# Patient Record
Sex: Female | Born: 2000 | Race: White | Hispanic: No | Marital: Single | Smoking: Never smoker
Health system: Southern US, Community
[De-identification: ages and names within clinical notes are randomized; demographics above are authoritative.]

---
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
## 2021-07-13 ENCOUNTER — Ambulatory Visit (INDEPENDENT_AMBULATORY_CARE_PROVIDER_SITE_OTHER): Payer: BC Managed Care – PPO | Admitting: Pediatrics

## 2021-07-13 ENCOUNTER — Other Ambulatory Visit: Payer: Self-pay

## 2021-07-13 DIAGNOSIS — Z130 Encounter for screening for diseases of the blood and blood-forming organs and certain disorders involving the immune mechanism: Secondary | ICD-10-CM | POA: Diagnosis not present

## 2021-07-13 DIAGNOSIS — Z23 Encounter for immunization: Secondary | ICD-10-CM | POA: Diagnosis not present

## 2021-07-13 NOTE — Progress Notes (Signed)
MenB vaccine per orders. Indications, contraindications and side effects of vaccine/vaccines discussed with parent and parent verbally expressed understanding and also agreed with the administration of vaccine/vaccines as ordered above today.Handout (VIS) given for each vaccine at this visit.  Sickle cell trait unknown, result required for school. Sickle cell lab per orders.

## 2021-07-15 LAB — SICKLE CELL SCREEN: Sickle Solubility Test - HGBRFX: NEGATIVE

## 2021-08-15 DIAGNOSIS — M79645 Pain in left finger(s): Secondary | ICD-10-CM | POA: Diagnosis not present

## 2021-09-12 DIAGNOSIS — M2211 Recurrent subluxation of patella, right knee: Secondary | ICD-10-CM | POA: Diagnosis not present

## 2021-09-12 DIAGNOSIS — M25561 Pain in right knee: Secondary | ICD-10-CM | POA: Diagnosis not present

## 2021-09-26 ENCOUNTER — Encounter (HOSPITAL_COMMUNITY): Payer: Self-pay | Admitting: Emergency Medicine

## 2021-09-26 ENCOUNTER — Ambulatory Visit (HOSPITAL_COMMUNITY)
Admission: EM | Admit: 2021-09-26 | Discharge: 2021-09-26 | Discharge status: Against Medical Advice | Payer: BC Managed Care – PPO

## 2021-09-26 ENCOUNTER — Other Ambulatory Visit: Payer: Self-pay

## 2021-09-26 NOTE — ED Triage Notes (Signed)
Pt c/o left big toe nail pain that thinks ingrown

## 2021-10-18 DIAGNOSIS — S83001S Unspecified subluxation of right patella, sequela: Secondary | ICD-10-CM | POA: Diagnosis not present

## 2021-10-18 DIAGNOSIS — M2241 Chondromalacia patellae, right knee: Secondary | ICD-10-CM | POA: Diagnosis not present

## 2022-01-01 DIAGNOSIS — M2211 Recurrent subluxation of patella, right knee: Secondary | ICD-10-CM | POA: Diagnosis not present

## 2022-01-09 DIAGNOSIS — G8918 Other acute postprocedural pain: Secondary | ICD-10-CM | POA: Diagnosis not present

## 2022-01-09 DIAGNOSIS — M94261 Chondromalacia, right knee: Secondary | ICD-10-CM | POA: Diagnosis not present

## 2022-01-09 DIAGNOSIS — M65861 Other synovitis and tenosynovitis, right lower leg: Secondary | ICD-10-CM | POA: Diagnosis not present

## 2022-01-09 DIAGNOSIS — M659 Synovitis and tenosynovitis, unspecified: Secondary | ICD-10-CM | POA: Diagnosis not present

## 2022-01-09 DIAGNOSIS — M25361 Other instability, right knee: Secondary | ICD-10-CM | POA: Diagnosis not present

## 2022-01-09 DIAGNOSIS — M6751 Plica syndrome, right knee: Secondary | ICD-10-CM | POA: Diagnosis not present

## 2022-01-09 DIAGNOSIS — S83001A Unspecified subluxation of right patella, initial encounter: Secondary | ICD-10-CM | POA: Diagnosis not present

## 2022-01-15 DIAGNOSIS — M25661 Stiffness of right knee, not elsewhere classified: Secondary | ICD-10-CM | POA: Diagnosis not present

## 2022-01-15 DIAGNOSIS — R6889 Other general symptoms and signs: Secondary | ICD-10-CM | POA: Diagnosis not present

## 2022-01-15 DIAGNOSIS — M25561 Pain in right knee: Secondary | ICD-10-CM | POA: Diagnosis not present

## 2022-01-15 DIAGNOSIS — Z9889 Other specified postprocedural states: Secondary | ICD-10-CM | POA: Diagnosis not present

## 2022-01-21 DIAGNOSIS — Z9889 Other specified postprocedural states: Secondary | ICD-10-CM | POA: Diagnosis not present

## 2022-01-21 DIAGNOSIS — M25661 Stiffness of right knee, not elsewhere classified: Secondary | ICD-10-CM | POA: Diagnosis not present

## 2022-01-21 DIAGNOSIS — R6889 Other general symptoms and signs: Secondary | ICD-10-CM | POA: Diagnosis not present

## 2022-01-21 DIAGNOSIS — M25561 Pain in right knee: Secondary | ICD-10-CM | POA: Diagnosis not present

## 2022-01-23 DIAGNOSIS — Z9889 Other specified postprocedural states: Secondary | ICD-10-CM | POA: Diagnosis not present

## 2022-01-23 DIAGNOSIS — M25661 Stiffness of right knee, not elsewhere classified: Secondary | ICD-10-CM | POA: Diagnosis not present

## 2022-01-23 DIAGNOSIS — R6889 Other general symptoms and signs: Secondary | ICD-10-CM | POA: Diagnosis not present

## 2022-01-23 DIAGNOSIS — M25561 Pain in right knee: Secondary | ICD-10-CM | POA: Diagnosis not present

## 2022-01-28 DIAGNOSIS — M25561 Pain in right knee: Secondary | ICD-10-CM | POA: Diagnosis not present

## 2022-01-28 DIAGNOSIS — M25661 Stiffness of right knee, not elsewhere classified: Secondary | ICD-10-CM | POA: Diagnosis not present

## 2022-01-28 DIAGNOSIS — R6889 Other general symptoms and signs: Secondary | ICD-10-CM | POA: Diagnosis not present

## 2022-01-28 DIAGNOSIS — Z9889 Other specified postprocedural states: Secondary | ICD-10-CM | POA: Diagnosis not present

## 2022-01-30 DIAGNOSIS — Z9889 Other specified postprocedural states: Secondary | ICD-10-CM | POA: Diagnosis not present

## 2022-01-30 DIAGNOSIS — M25561 Pain in right knee: Secondary | ICD-10-CM | POA: Diagnosis not present

## 2022-01-30 DIAGNOSIS — M25661 Stiffness of right knee, not elsewhere classified: Secondary | ICD-10-CM | POA: Diagnosis not present

## 2022-01-30 DIAGNOSIS — R6889 Other general symptoms and signs: Secondary | ICD-10-CM | POA: Diagnosis not present

## 2022-02-06 DIAGNOSIS — M25561 Pain in right knee: Secondary | ICD-10-CM | POA: Diagnosis not present

## 2022-02-06 DIAGNOSIS — M25661 Stiffness of right knee, not elsewhere classified: Secondary | ICD-10-CM | POA: Diagnosis not present

## 2022-02-06 DIAGNOSIS — R6889 Other general symptoms and signs: Secondary | ICD-10-CM | POA: Diagnosis not present

## 2022-02-06 DIAGNOSIS — Z9889 Other specified postprocedural states: Secondary | ICD-10-CM | POA: Diagnosis not present

## 2022-02-11 DIAGNOSIS — R6889 Other general symptoms and signs: Secondary | ICD-10-CM | POA: Diagnosis not present

## 2022-02-11 DIAGNOSIS — M25661 Stiffness of right knee, not elsewhere classified: Secondary | ICD-10-CM | POA: Diagnosis not present

## 2022-02-11 DIAGNOSIS — Z9889 Other specified postprocedural states: Secondary | ICD-10-CM | POA: Diagnosis not present

## 2022-02-11 DIAGNOSIS — M25561 Pain in right knee: Secondary | ICD-10-CM | POA: Diagnosis not present

## 2022-02-13 ENCOUNTER — Other Ambulatory Visit: Payer: Self-pay | Admitting: Orthopedic Surgery

## 2022-02-13 DIAGNOSIS — M25562 Pain in left knee: Secondary | ICD-10-CM

## 2022-02-14 ENCOUNTER — Ambulatory Visit
Admission: RE | Admit: 2022-02-14 | Discharge: 2022-02-14 | Disposition: A | Discharge status: Home / Self Care | Payer: No Typology Code available for payment source | Source: Ambulatory Visit | Attending: Orthopedic Surgery | Admitting: Orthopedic Surgery

## 2022-02-14 ENCOUNTER — Other Ambulatory Visit: Payer: Self-pay

## 2022-02-14 DIAGNOSIS — M25562 Pain in left knee: Secondary | ICD-10-CM

## 2022-02-18 DIAGNOSIS — Z9889 Other specified postprocedural states: Secondary | ICD-10-CM | POA: Diagnosis not present

## 2022-02-18 DIAGNOSIS — R6889 Other general symptoms and signs: Secondary | ICD-10-CM | POA: Diagnosis not present

## 2022-02-18 DIAGNOSIS — M25561 Pain in right knee: Secondary | ICD-10-CM | POA: Diagnosis not present

## 2022-02-18 DIAGNOSIS — M25661 Stiffness of right knee, not elsewhere classified: Secondary | ICD-10-CM | POA: Diagnosis not present

## 2022-02-19 DIAGNOSIS — S83512A Sprain of anterior cruciate ligament of left knee, initial encounter: Secondary | ICD-10-CM | POA: Diagnosis not present

## 2022-02-19 DIAGNOSIS — S83242A Other tear of medial meniscus, current injury, left knee, initial encounter: Secondary | ICD-10-CM | POA: Diagnosis not present

## 2022-02-19 DIAGNOSIS — M228X2 Other disorders of patella, left knee: Secondary | ICD-10-CM | POA: Diagnosis not present

## 2022-03-06 DIAGNOSIS — X58XXXA Exposure to other specified factors, initial encounter: Secondary | ICD-10-CM | POA: Diagnosis not present

## 2022-03-06 DIAGNOSIS — S83232A Complex tear of medial meniscus, current injury, left knee, initial encounter: Secondary | ICD-10-CM | POA: Diagnosis not present

## 2022-03-06 DIAGNOSIS — M228X2 Other disorders of patella, left knee: Secondary | ICD-10-CM | POA: Diagnosis not present

## 2022-03-06 DIAGNOSIS — G8918 Other acute postprocedural pain: Secondary | ICD-10-CM | POA: Diagnosis not present

## 2022-03-06 DIAGNOSIS — S83512A Sprain of anterior cruciate ligament of left knee, initial encounter: Secondary | ICD-10-CM | POA: Diagnosis not present

## 2022-03-06 DIAGNOSIS — Y999 Unspecified external cause status: Secondary | ICD-10-CM | POA: Diagnosis not present

## 2022-03-12 DIAGNOSIS — M25662 Stiffness of left knee, not elsewhere classified: Secondary | ICD-10-CM | POA: Diagnosis not present

## 2022-03-12 DIAGNOSIS — Z9889 Other specified postprocedural states: Secondary | ICD-10-CM | POA: Diagnosis not present

## 2022-03-12 DIAGNOSIS — M25562 Pain in left knee: Secondary | ICD-10-CM | POA: Diagnosis not present

## 2022-03-12 DIAGNOSIS — Z7409 Other reduced mobility: Secondary | ICD-10-CM | POA: Diagnosis not present

## 2022-03-12 DIAGNOSIS — R29898 Other symptoms and signs involving the musculoskeletal system: Secondary | ICD-10-CM | POA: Diagnosis not present

## 2022-03-20 DIAGNOSIS — Z7409 Other reduced mobility: Secondary | ICD-10-CM | POA: Diagnosis not present

## 2022-03-20 DIAGNOSIS — M25562 Pain in left knee: Secondary | ICD-10-CM | POA: Diagnosis not present

## 2022-03-20 DIAGNOSIS — M25662 Stiffness of left knee, not elsewhere classified: Secondary | ICD-10-CM | POA: Diagnosis not present

## 2022-03-20 DIAGNOSIS — R29898 Other symptoms and signs involving the musculoskeletal system: Secondary | ICD-10-CM | POA: Diagnosis not present

## 2022-03-20 DIAGNOSIS — Z9889 Other specified postprocedural states: Secondary | ICD-10-CM | POA: Diagnosis not present

## 2022-03-27 DIAGNOSIS — Z7409 Other reduced mobility: Secondary | ICD-10-CM | POA: Diagnosis not present

## 2022-03-27 DIAGNOSIS — M25562 Pain in left knee: Secondary | ICD-10-CM | POA: Diagnosis not present

## 2022-03-27 DIAGNOSIS — Z9889 Other specified postprocedural states: Secondary | ICD-10-CM | POA: Diagnosis not present

## 2022-03-27 DIAGNOSIS — M25662 Stiffness of left knee, not elsewhere classified: Secondary | ICD-10-CM | POA: Diagnosis not present

## 2022-03-27 DIAGNOSIS — R29898 Other symptoms and signs involving the musculoskeletal system: Secondary | ICD-10-CM | POA: Diagnosis not present

## 2022-04-03 DIAGNOSIS — R29898 Other symptoms and signs involving the musculoskeletal system: Secondary | ICD-10-CM | POA: Diagnosis not present

## 2022-04-03 DIAGNOSIS — Z7409 Other reduced mobility: Secondary | ICD-10-CM | POA: Diagnosis not present

## 2022-04-03 DIAGNOSIS — M25562 Pain in left knee: Secondary | ICD-10-CM | POA: Diagnosis not present

## 2022-04-03 DIAGNOSIS — M25662 Stiffness of left knee, not elsewhere classified: Secondary | ICD-10-CM | POA: Diagnosis not present

## 2022-04-09 DIAGNOSIS — M25562 Pain in left knee: Secondary | ICD-10-CM | POA: Diagnosis not present

## 2022-04-09 DIAGNOSIS — R29898 Other symptoms and signs involving the musculoskeletal system: Secondary | ICD-10-CM | POA: Diagnosis not present

## 2022-04-09 DIAGNOSIS — Z9889 Other specified postprocedural states: Secondary | ICD-10-CM | POA: Diagnosis not present

## 2022-04-09 DIAGNOSIS — Z7409 Other reduced mobility: Secondary | ICD-10-CM | POA: Diagnosis not present

## 2022-04-09 DIAGNOSIS — M25662 Stiffness of left knee, not elsewhere classified: Secondary | ICD-10-CM | POA: Diagnosis not present

## 2022-04-15 DIAGNOSIS — M25662 Stiffness of left knee, not elsewhere classified: Secondary | ICD-10-CM | POA: Diagnosis not present

## 2022-04-15 DIAGNOSIS — R29898 Other symptoms and signs involving the musculoskeletal system: Secondary | ICD-10-CM | POA: Diagnosis not present

## 2022-04-15 DIAGNOSIS — M25562 Pain in left knee: Secondary | ICD-10-CM | POA: Diagnosis not present

## 2022-04-15 DIAGNOSIS — Z9889 Other specified postprocedural states: Secondary | ICD-10-CM | POA: Diagnosis not present

## 2022-04-15 DIAGNOSIS — Z7409 Other reduced mobility: Secondary | ICD-10-CM | POA: Diagnosis not present

## 2022-04-22 DIAGNOSIS — R29898 Other symptoms and signs involving the musculoskeletal system: Secondary | ICD-10-CM | POA: Diagnosis not present

## 2022-04-22 DIAGNOSIS — M25562 Pain in left knee: Secondary | ICD-10-CM | POA: Diagnosis not present

## 2022-04-22 DIAGNOSIS — Z7409 Other reduced mobility: Secondary | ICD-10-CM | POA: Diagnosis not present

## 2022-04-22 DIAGNOSIS — M25662 Stiffness of left knee, not elsewhere classified: Secondary | ICD-10-CM | POA: Diagnosis not present

## 2022-04-25 DIAGNOSIS — M25562 Pain in left knee: Secondary | ICD-10-CM | POA: Diagnosis not present

## 2022-04-25 DIAGNOSIS — M25662 Stiffness of left knee, not elsewhere classified: Secondary | ICD-10-CM | POA: Diagnosis not present

## 2022-04-25 DIAGNOSIS — Z9889 Other specified postprocedural states: Secondary | ICD-10-CM | POA: Diagnosis not present

## 2022-04-25 DIAGNOSIS — R29898 Other symptoms and signs involving the musculoskeletal system: Secondary | ICD-10-CM | POA: Diagnosis not present

## 2022-04-25 DIAGNOSIS — Z7409 Other reduced mobility: Secondary | ICD-10-CM | POA: Diagnosis not present

## 2022-04-29 DIAGNOSIS — Z9889 Other specified postprocedural states: Secondary | ICD-10-CM | POA: Diagnosis not present

## 2022-04-29 DIAGNOSIS — M25562 Pain in left knee: Secondary | ICD-10-CM | POA: Diagnosis not present

## 2022-04-29 DIAGNOSIS — M25662 Stiffness of left knee, not elsewhere classified: Secondary | ICD-10-CM | POA: Diagnosis not present

## 2022-04-29 DIAGNOSIS — Z7409 Other reduced mobility: Secondary | ICD-10-CM | POA: Diagnosis not present

## 2022-04-29 DIAGNOSIS — R29898 Other symptoms and signs involving the musculoskeletal system: Secondary | ICD-10-CM | POA: Diagnosis not present

## 2022-05-02 DIAGNOSIS — M25662 Stiffness of left knee, not elsewhere classified: Secondary | ICD-10-CM | POA: Diagnosis not present

## 2022-05-02 DIAGNOSIS — R29898 Other symptoms and signs involving the musculoskeletal system: Secondary | ICD-10-CM | POA: Diagnosis not present

## 2022-05-02 DIAGNOSIS — M25562 Pain in left knee: Secondary | ICD-10-CM | POA: Diagnosis not present

## 2022-05-02 DIAGNOSIS — Z7409 Other reduced mobility: Secondary | ICD-10-CM | POA: Diagnosis not present

## 2022-05-06 DIAGNOSIS — M25562 Pain in left knee: Secondary | ICD-10-CM | POA: Diagnosis not present

## 2022-05-06 DIAGNOSIS — Z7409 Other reduced mobility: Secondary | ICD-10-CM | POA: Diagnosis not present

## 2022-05-06 DIAGNOSIS — M25662 Stiffness of left knee, not elsewhere classified: Secondary | ICD-10-CM | POA: Diagnosis not present

## 2022-05-06 DIAGNOSIS — R29898 Other symptoms and signs involving the musculoskeletal system: Secondary | ICD-10-CM | POA: Diagnosis not present

## 2022-05-09 DIAGNOSIS — Z9889 Other specified postprocedural states: Secondary | ICD-10-CM | POA: Diagnosis not present

## 2022-05-09 DIAGNOSIS — M25662 Stiffness of left knee, not elsewhere classified: Secondary | ICD-10-CM | POA: Diagnosis not present

## 2022-05-09 DIAGNOSIS — Z7409 Other reduced mobility: Secondary | ICD-10-CM | POA: Diagnosis not present

## 2022-05-09 DIAGNOSIS — M25562 Pain in left knee: Secondary | ICD-10-CM | POA: Diagnosis not present

## 2022-05-09 DIAGNOSIS — R29898 Other symptoms and signs involving the musculoskeletal system: Secondary | ICD-10-CM | POA: Diagnosis not present

## 2022-05-13 DIAGNOSIS — M25662 Stiffness of left knee, not elsewhere classified: Secondary | ICD-10-CM | POA: Diagnosis not present

## 2022-05-13 DIAGNOSIS — Z9889 Other specified postprocedural states: Secondary | ICD-10-CM | POA: Diagnosis not present

## 2022-05-13 DIAGNOSIS — R29898 Other symptoms and signs involving the musculoskeletal system: Secondary | ICD-10-CM | POA: Diagnosis not present

## 2022-05-13 DIAGNOSIS — M25562 Pain in left knee: Secondary | ICD-10-CM | POA: Diagnosis not present

## 2022-05-13 DIAGNOSIS — Z7409 Other reduced mobility: Secondary | ICD-10-CM | POA: Diagnosis not present

## 2022-05-16 DIAGNOSIS — M25662 Stiffness of left knee, not elsewhere classified: Secondary | ICD-10-CM | POA: Diagnosis not present

## 2022-05-16 DIAGNOSIS — M25562 Pain in left knee: Secondary | ICD-10-CM | POA: Diagnosis not present

## 2022-05-16 DIAGNOSIS — Z7409 Other reduced mobility: Secondary | ICD-10-CM | POA: Diagnosis not present

## 2022-05-16 DIAGNOSIS — R29898 Other symptoms and signs involving the musculoskeletal system: Secondary | ICD-10-CM | POA: Diagnosis not present

## 2022-05-21 DIAGNOSIS — Z7409 Other reduced mobility: Secondary | ICD-10-CM | POA: Diagnosis not present

## 2022-05-21 DIAGNOSIS — R29898 Other symptoms and signs involving the musculoskeletal system: Secondary | ICD-10-CM | POA: Diagnosis not present

## 2022-05-21 DIAGNOSIS — M25562 Pain in left knee: Secondary | ICD-10-CM | POA: Diagnosis not present

## 2022-05-21 DIAGNOSIS — M25662 Stiffness of left knee, not elsewhere classified: Secondary | ICD-10-CM | POA: Diagnosis not present

## 2022-05-21 DIAGNOSIS — Z9889 Other specified postprocedural states: Secondary | ICD-10-CM | POA: Diagnosis not present

## 2022-05-23 DIAGNOSIS — M25562 Pain in left knee: Secondary | ICD-10-CM | POA: Diagnosis not present

## 2022-05-23 DIAGNOSIS — M25662 Stiffness of left knee, not elsewhere classified: Secondary | ICD-10-CM | POA: Diagnosis not present

## 2022-05-23 DIAGNOSIS — Z7409 Other reduced mobility: Secondary | ICD-10-CM | POA: Diagnosis not present

## 2022-05-23 DIAGNOSIS — Z9889 Other specified postprocedural states: Secondary | ICD-10-CM | POA: Diagnosis not present

## 2022-05-23 DIAGNOSIS — R29898 Other symptoms and signs involving the musculoskeletal system: Secondary | ICD-10-CM | POA: Diagnosis not present

## 2022-05-27 DIAGNOSIS — M25662 Stiffness of left knee, not elsewhere classified: Secondary | ICD-10-CM | POA: Diagnosis not present

## 2022-05-27 DIAGNOSIS — Z7409 Other reduced mobility: Secondary | ICD-10-CM | POA: Diagnosis not present

## 2022-05-27 DIAGNOSIS — Z9889 Other specified postprocedural states: Secondary | ICD-10-CM | POA: Diagnosis not present

## 2022-05-27 DIAGNOSIS — M25562 Pain in left knee: Secondary | ICD-10-CM | POA: Diagnosis not present

## 2022-05-27 DIAGNOSIS — R29898 Other symptoms and signs involving the musculoskeletal system: Secondary | ICD-10-CM | POA: Diagnosis not present

## 2022-05-30 DIAGNOSIS — Z7409 Other reduced mobility: Secondary | ICD-10-CM | POA: Diagnosis not present

## 2022-05-30 DIAGNOSIS — R29898 Other symptoms and signs involving the musculoskeletal system: Secondary | ICD-10-CM | POA: Diagnosis not present

## 2022-05-30 DIAGNOSIS — M25662 Stiffness of left knee, not elsewhere classified: Secondary | ICD-10-CM | POA: Diagnosis not present

## 2022-05-30 DIAGNOSIS — Z9889 Other specified postprocedural states: Secondary | ICD-10-CM | POA: Diagnosis not present

## 2022-05-30 DIAGNOSIS — M25562 Pain in left knee: Secondary | ICD-10-CM | POA: Diagnosis not present

## 2022-06-03 DIAGNOSIS — R29898 Other symptoms and signs involving the musculoskeletal system: Secondary | ICD-10-CM | POA: Diagnosis not present

## 2022-06-03 DIAGNOSIS — M25662 Stiffness of left knee, not elsewhere classified: Secondary | ICD-10-CM | POA: Diagnosis not present

## 2022-06-03 DIAGNOSIS — Z7409 Other reduced mobility: Secondary | ICD-10-CM | POA: Diagnosis not present

## 2022-06-03 DIAGNOSIS — M25562 Pain in left knee: Secondary | ICD-10-CM | POA: Diagnosis not present

## 2022-06-03 DIAGNOSIS — Z9889 Other specified postprocedural states: Secondary | ICD-10-CM | POA: Diagnosis not present

## 2022-06-06 DIAGNOSIS — M25562 Pain in left knee: Secondary | ICD-10-CM | POA: Diagnosis not present

## 2022-06-06 DIAGNOSIS — Z9889 Other specified postprocedural states: Secondary | ICD-10-CM | POA: Diagnosis not present

## 2022-06-06 DIAGNOSIS — R29898 Other symptoms and signs involving the musculoskeletal system: Secondary | ICD-10-CM | POA: Diagnosis not present

## 2022-06-06 DIAGNOSIS — M25662 Stiffness of left knee, not elsewhere classified: Secondary | ICD-10-CM | POA: Diagnosis not present

## 2022-06-06 DIAGNOSIS — Z7409 Other reduced mobility: Secondary | ICD-10-CM | POA: Diagnosis not present

## 2022-06-17 DIAGNOSIS — M25662 Stiffness of left knee, not elsewhere classified: Secondary | ICD-10-CM | POA: Diagnosis not present

## 2022-06-17 DIAGNOSIS — Z9889 Other specified postprocedural states: Secondary | ICD-10-CM | POA: Diagnosis not present

## 2022-06-17 DIAGNOSIS — Z7409 Other reduced mobility: Secondary | ICD-10-CM | POA: Diagnosis not present

## 2022-06-17 DIAGNOSIS — R29898 Other symptoms and signs involving the musculoskeletal system: Secondary | ICD-10-CM | POA: Diagnosis not present

## 2022-06-17 DIAGNOSIS — M25562 Pain in left knee: Secondary | ICD-10-CM | POA: Diagnosis not present

## 2022-06-20 DIAGNOSIS — Z7409 Other reduced mobility: Secondary | ICD-10-CM | POA: Diagnosis not present

## 2022-06-20 DIAGNOSIS — M25662 Stiffness of left knee, not elsewhere classified: Secondary | ICD-10-CM | POA: Diagnosis not present

## 2022-06-20 DIAGNOSIS — R29898 Other symptoms and signs involving the musculoskeletal system: Secondary | ICD-10-CM | POA: Diagnosis not present

## 2022-06-20 DIAGNOSIS — Z9889 Other specified postprocedural states: Secondary | ICD-10-CM | POA: Diagnosis not present

## 2022-06-20 DIAGNOSIS — M25562 Pain in left knee: Secondary | ICD-10-CM | POA: Diagnosis not present

## 2022-06-26 DIAGNOSIS — Z7409 Other reduced mobility: Secondary | ICD-10-CM | POA: Diagnosis not present

## 2022-06-26 DIAGNOSIS — M25562 Pain in left knee: Secondary | ICD-10-CM | POA: Diagnosis not present

## 2022-06-26 DIAGNOSIS — R29898 Other symptoms and signs involving the musculoskeletal system: Secondary | ICD-10-CM | POA: Diagnosis not present

## 2022-06-26 DIAGNOSIS — M25662 Stiffness of left knee, not elsewhere classified: Secondary | ICD-10-CM | POA: Diagnosis not present

## 2022-07-08 DIAGNOSIS — M25562 Pain in left knee: Secondary | ICD-10-CM | POA: Diagnosis not present

## 2022-07-08 DIAGNOSIS — Z9889 Other specified postprocedural states: Secondary | ICD-10-CM | POA: Diagnosis not present

## 2022-07-08 DIAGNOSIS — Z7409 Other reduced mobility: Secondary | ICD-10-CM | POA: Diagnosis not present

## 2022-07-08 DIAGNOSIS — M25662 Stiffness of left knee, not elsewhere classified: Secondary | ICD-10-CM | POA: Diagnosis not present

## 2022-07-08 DIAGNOSIS — R29898 Other symptoms and signs involving the musculoskeletal system: Secondary | ICD-10-CM | POA: Diagnosis not present

## 2022-07-11 DIAGNOSIS — M25662 Stiffness of left knee, not elsewhere classified: Secondary | ICD-10-CM | POA: Diagnosis not present

## 2022-07-11 DIAGNOSIS — M25562 Pain in left knee: Secondary | ICD-10-CM | POA: Diagnosis not present

## 2022-07-11 DIAGNOSIS — R29898 Other symptoms and signs involving the musculoskeletal system: Secondary | ICD-10-CM | POA: Diagnosis not present

## 2022-07-11 DIAGNOSIS — Z7409 Other reduced mobility: Secondary | ICD-10-CM | POA: Diagnosis not present

## 2022-07-18 DIAGNOSIS — Z7409 Other reduced mobility: Secondary | ICD-10-CM | POA: Diagnosis not present

## 2022-07-18 DIAGNOSIS — R29898 Other symptoms and signs involving the musculoskeletal system: Secondary | ICD-10-CM | POA: Diagnosis not present

## 2022-07-18 DIAGNOSIS — M25662 Stiffness of left knee, not elsewhere classified: Secondary | ICD-10-CM | POA: Diagnosis not present

## 2022-07-18 DIAGNOSIS — M25562 Pain in left knee: Secondary | ICD-10-CM | POA: Diagnosis not present

## 2022-07-22 DIAGNOSIS — M25562 Pain in left knee: Secondary | ICD-10-CM | POA: Diagnosis not present

## 2022-07-22 DIAGNOSIS — R29898 Other symptoms and signs involving the musculoskeletal system: Secondary | ICD-10-CM | POA: Diagnosis not present

## 2022-07-22 DIAGNOSIS — M25662 Stiffness of left knee, not elsewhere classified: Secondary | ICD-10-CM | POA: Diagnosis not present

## 2022-07-22 DIAGNOSIS — Z7409 Other reduced mobility: Secondary | ICD-10-CM | POA: Diagnosis not present

## 2022-07-25 DIAGNOSIS — M25562 Pain in left knee: Secondary | ICD-10-CM | POA: Diagnosis not present

## 2022-07-25 DIAGNOSIS — Z7409 Other reduced mobility: Secondary | ICD-10-CM | POA: Diagnosis not present

## 2022-07-25 DIAGNOSIS — M25662 Stiffness of left knee, not elsewhere classified: Secondary | ICD-10-CM | POA: Diagnosis not present

## 2022-07-25 DIAGNOSIS — R29898 Other symptoms and signs involving the musculoskeletal system: Secondary | ICD-10-CM | POA: Diagnosis not present

## 2022-07-29 DIAGNOSIS — Z9889 Other specified postprocedural states: Secondary | ICD-10-CM | POA: Diagnosis not present

## 2022-07-29 DIAGNOSIS — R29898 Other symptoms and signs involving the musculoskeletal system: Secondary | ICD-10-CM | POA: Diagnosis not present

## 2022-07-29 DIAGNOSIS — Z7409 Other reduced mobility: Secondary | ICD-10-CM | POA: Diagnosis not present

## 2022-08-01 DIAGNOSIS — Z9889 Other specified postprocedural states: Secondary | ICD-10-CM | POA: Diagnosis not present

## 2022-08-01 DIAGNOSIS — Z7409 Other reduced mobility: Secondary | ICD-10-CM | POA: Diagnosis not present

## 2022-08-01 DIAGNOSIS — R29898 Other symptoms and signs involving the musculoskeletal system: Secondary | ICD-10-CM | POA: Diagnosis not present

## 2022-08-05 ENCOUNTER — Encounter: Payer: Self-pay | Admitting: Pediatrics

## 2022-08-22 DIAGNOSIS — Z9889 Other specified postprocedural states: Secondary | ICD-10-CM | POA: Diagnosis not present

## 2022-09-11 DIAGNOSIS — Z9889 Other specified postprocedural states: Secondary | ICD-10-CM | POA: Diagnosis not present

## 2023-03-29 IMAGING — MR MR KNEE*L* W/O CM
4 of 7 series · 19 of 40 positions shown · non-contrast
Comparison: MR knee 06/07/2020; X-ray knee 02/13/2022.

CLINICAL DATA: Left knee pain for 2 weeks. Patient reports she felt
a sudden random pop in the back of knee.

EXAM:
MRI OF THE LEFT KNEE WITHOUT CONTRAST
TECHNIQUE: Multiplanar, multisequence MR imaging of the knee was performed. No
intravenous contrast was administered.

[Series 3: T2 fat-sat · axial · 4.0mm · 0.50mm/px · z∈[-23,+72]mm · 3 of 24 slices shown]
[im 5/24]
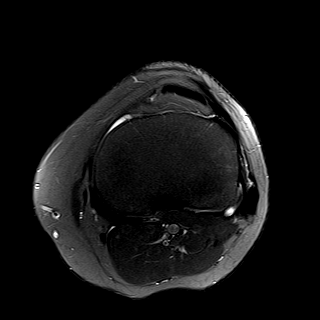
[im 14/24]
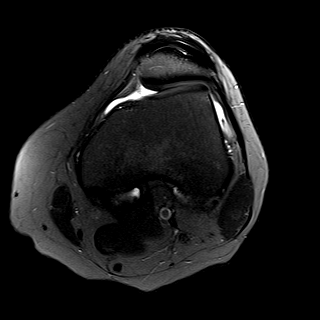
[im 24/24]
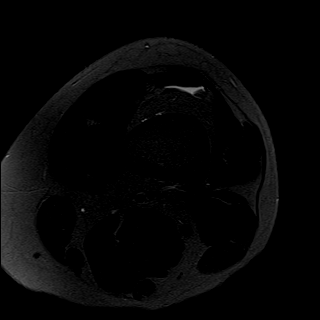

[Series 7: PD fat-sat · sagittal · 3.0mm · 0.29mm/px · 6 of 28 slices shown (1 of 3)]
[im 1/28]
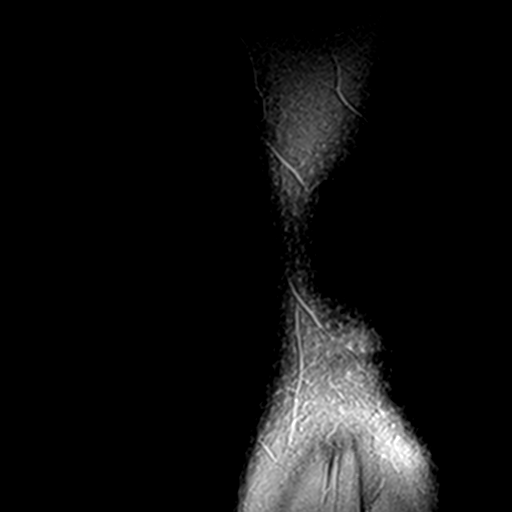
[im 6/28]
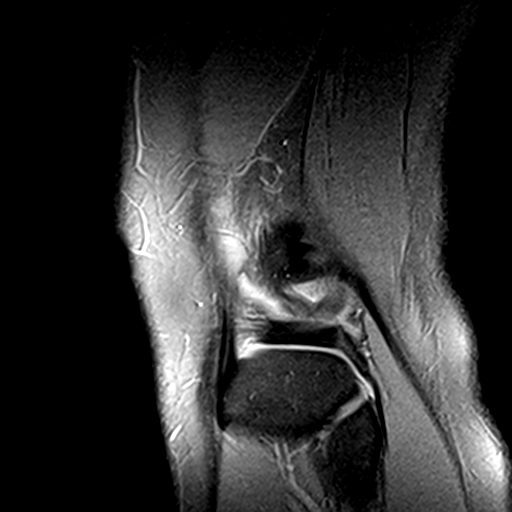
[im 11/28]
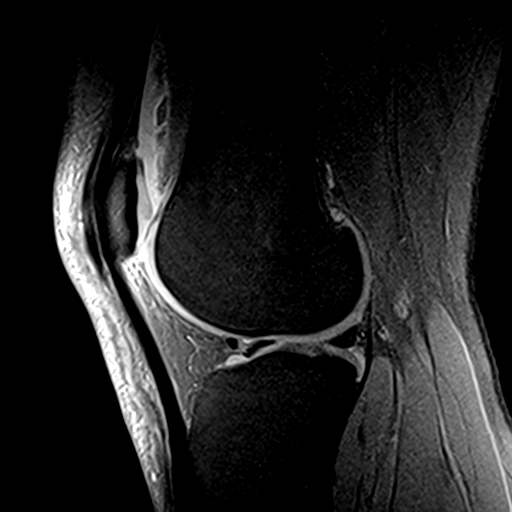
[im 17/28]
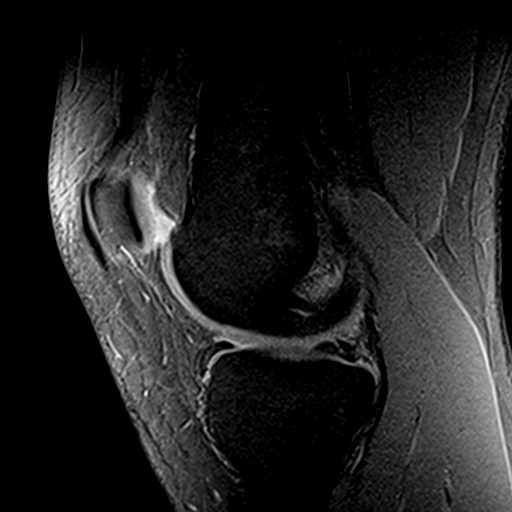
[im 22/28]
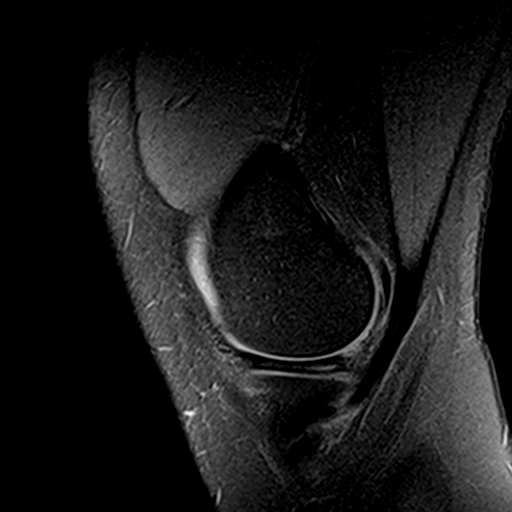
[im 28/28]
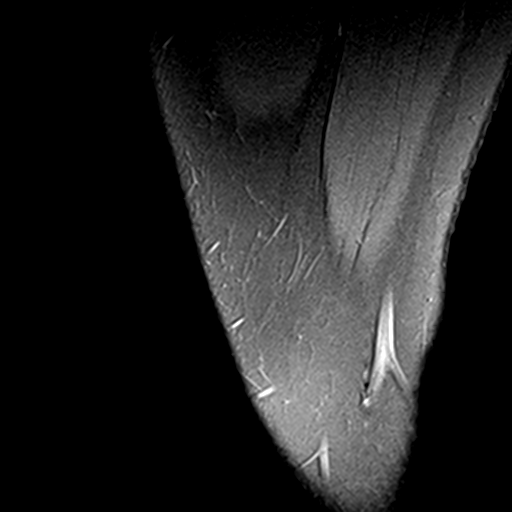

[Series 8: PD fat-sat · coronal · 3.0mm · 0.29mm/px · 7 of 32 slices shown (2 of 3)]
[im 1/32]
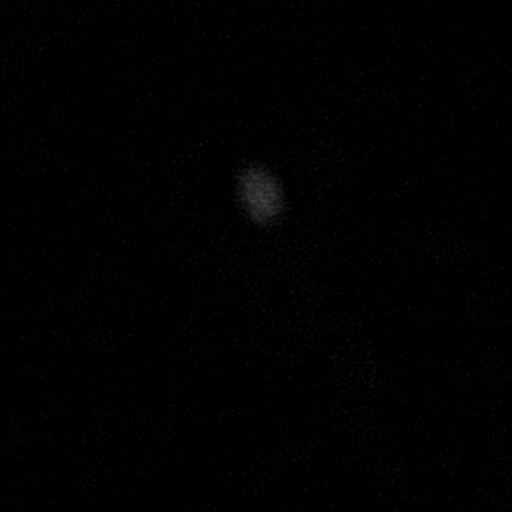
[im 6/32]
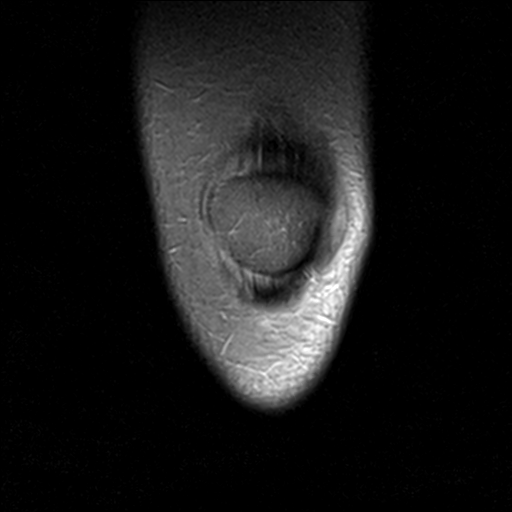
[im 11/32]
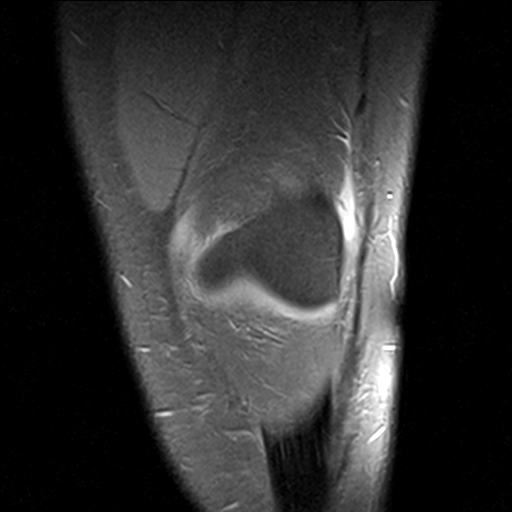
[im 16/32]
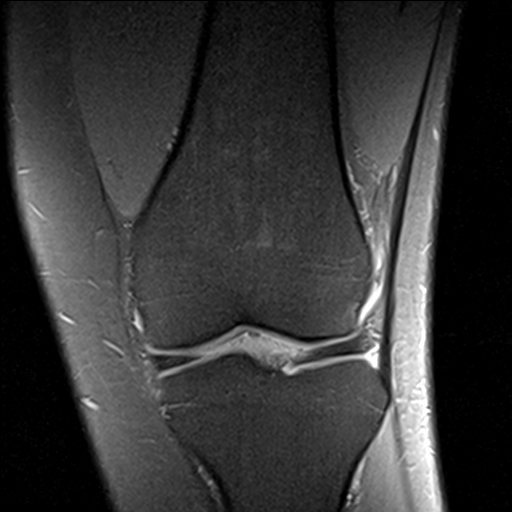
[im 21/32]
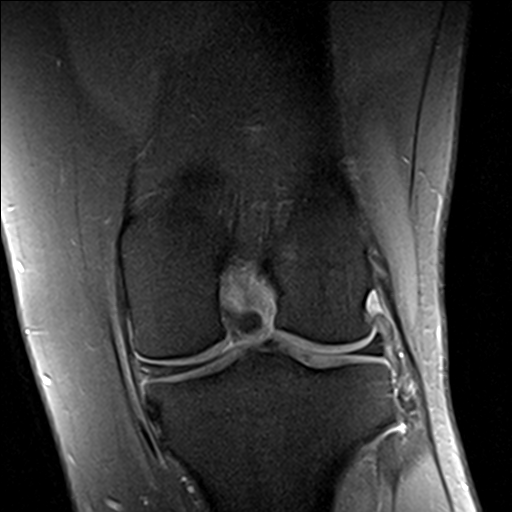
[im 26/32]
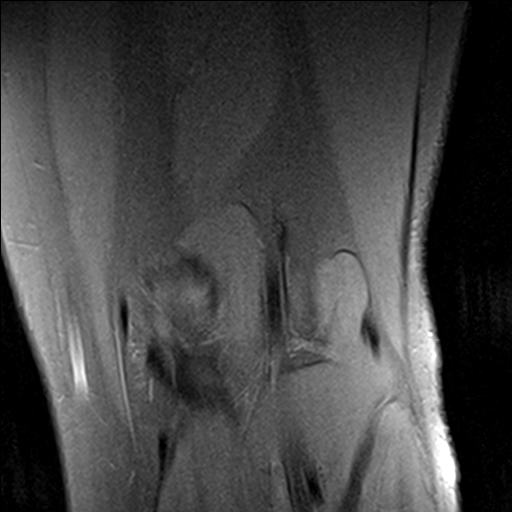
[im 32/32]
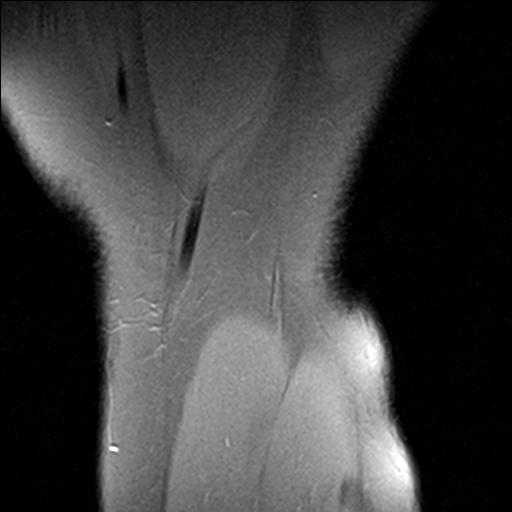

[Series 9: PD fat-sat · oblique · 2.3mm · 0.29mm/px · 3 of 11 slices shown (3 of 3)]
[im 1/11]
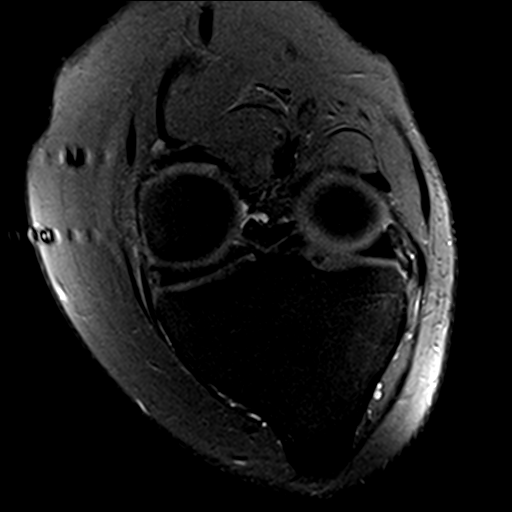
[im 6/11]
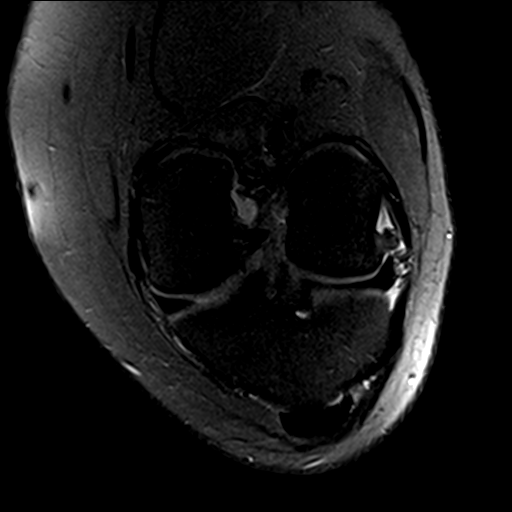
[im 11/11]
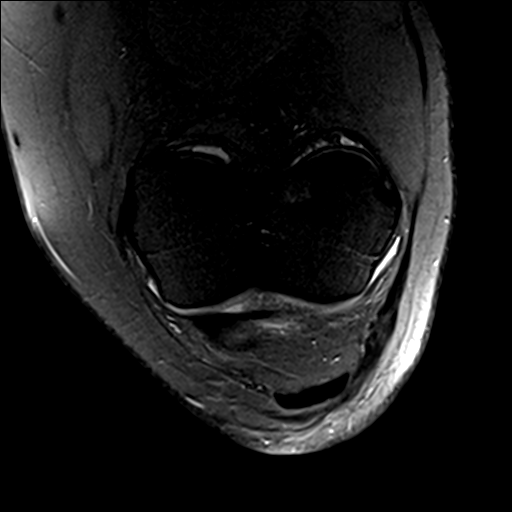

[19 of 40 positions shown; findings below may reference images not displayed]

FINDINGS: MENISCI

Medial meniscus: There is new diffuse intermediate proton density
signal intrasubstance degeneration throughout the posterior horn in
the majority of the posterior segment of the body of the medial
meniscus. There is horizontal linear intermediate proton density
signal within the middle and peripheral thirds of the meniscal
triangle of the posterior horn of the medial meniscus diffusely
(sagittal images 17 through 22) that extends through the posterior
wall of the meniscal triangle. At the root of the posterior horn
this linear increased signal also minimally extends through the
superior articular surface, a possible new small tear (sagittal
series 7, image 17).

Lateral meniscus:  Intact.

LIGAMENTS

Cruciates: There is abnormal intermediate T2 signal thickening and
waviness of the mid and superior fibers of the ACL new from
06/07/2020 and suspicious for a subacute tear. There is very mild
marrow edema within the posterior aspect of the lateral tibial
plateau (sagittal image 9) and the mid to anterior aspect of the
lateral femoral condyle (sagittal image 8) suggesting a mild acute
pivot-shift marrow contusion pattern, possibly subacute given the
faint intensity. PCL is intact.

Collaterals: The medial collateral ligament is intact. The fibular
collateral ligament, biceps femoris tendon, iliotibial band, and
popliteus tendon are intact.

CARTILAGE

Patellofemoral:  Intact.  Shallow trochlear notch.

Medial:  Intact.

Lateral:  Intact.

Joint: Nojoint effusion. Mild edema within the superolateral aspect
of Hoffa's fat pad as can be seen with infrapatellar fat pad
impingement. The tibial tuberosity-trochlear groove distance
measures 15 mm, mildly increased as can be seen with lateral
patellofemoral maltracking.

Popliteal Fossa:  No Baker's cyst.

Extensor Mechanism: There is thin linear fluid bright signal
extending through the distal quadriceps tendon in the proximal
patellar tendon suggesting small interstitial tears. This is within
the midsubstance of the distal quadriceps tendon and proximal
patellar tendon but also appears to mildly extend through the
anterior surface of the junction of the tendons at the inferior
aspect of the patella (sagittal series 7, images 13 and 14). No
tendon retraction.

Bones:  No acute fracture or dislocation.

Other: None.
IMPRESSION: :
IMPRESSION: 1. New intrasubstance degeneration within the posterior horn of the
medial meniscus with horizontal linear fluid sensitive signal that
extends through the posterior wall. Possible intersecting oblique
tear extending through the superior articular surface of the
peripheral third of the meniscal triangle at the root of the
posterior horn of the medial meniscus (sagittal image 17).
2. Likely subacute partial-thickness ACL tear. Faint acute
pivot-shift marrow contusion pattern.
3. Mild edema within the superolateral aspect of Hoffa's fat pad as
can be seen with infrapatellar fat pad impingement. The tibial
tuberosity-trochlear groove distance is at the upper limits of
normal suggesting borderline lateral patellofemoral maltracking.
4. Thin linear interstitial tears within the distal quadriceps
tendon and proximal patellar tendon. No tendon retraction.

## 2023-09-02 ENCOUNTER — Encounter: Payer: Self-pay | Admitting: Pediatrics
